# Patient Record
Sex: Female | Born: 1955 | Race: Black or African American | Hispanic: No | Marital: Married
Health system: Southern US, Community
[De-identification: ages and names within clinical notes are randomized; demographics above are authoritative.]

---

## 2008-06-21 ENCOUNTER — Ambulatory Visit: Payer: Self-pay | Admitting: Family Medicine

## 2010-03-12 ENCOUNTER — Ambulatory Visit: Payer: Self-pay | Admitting: Family Medicine

## 2011-12-02 ENCOUNTER — Ambulatory Visit: Payer: Self-pay

## 2013-03-20 ENCOUNTER — Ambulatory Visit: Payer: Self-pay

## 2014-08-13 ENCOUNTER — Ambulatory Visit: Payer: Self-pay

## 2015-08-15 ENCOUNTER — Other Ambulatory Visit: Payer: Self-pay | Admitting: Physician Assistant

## 2015-08-15 DIAGNOSIS — Z139 Encounter for screening, unspecified: Secondary | ICD-10-CM

## 2015-09-02 ENCOUNTER — Ambulatory Visit: Payer: Self-pay | Attending: Oncology

## 2015-09-02 ENCOUNTER — Ambulatory Visit
Admission: RE | Admit: 2015-09-02 | Discharge: 2015-09-02 | Disposition: A | Discharge status: Home / Self Care | Payer: Self-pay | Source: Ambulatory Visit | Attending: Oncology | Admitting: Oncology

## 2015-09-02 ENCOUNTER — Ambulatory Visit: Payer: Self-pay

## 2015-09-02 VITALS — BP 127/82 | HR 75 | Temp 96.3°F | Resp 20 | Ht 70.47 in | Wt 155.3 lb

## 2015-09-02 DIAGNOSIS — Z0000 Encounter for general adult medical examination without abnormal findings: Secondary | ICD-10-CM

## 2015-09-02 NOTE — Progress Notes (Signed)
Subjective:     Patient ID: Meredith Dunn, female   DOB: 1955-08-16, 60 y.o.   MRN: [id number]  HPI   Review of Systems     Objective:   Physical Exam  Pulmonary/Chest:   Right breast exhibits no inverted nipple, no mass, no nipple discharge, no skin change and no tenderness. Left breast exhibits no inverted nipple, no mass, no nipple discharge, no skin change and no tenderness. Breasts are symmetrical.  Soft mobile mass just above bra line on left back.       Assessment:     60 year old patient presents for [facility] clinic visit.  Patient screened, and meets [facility] eligibility.  Patient does not have insurance, Medicare or Medicaid.  Handout given on Affordable Care Act. Instructed patient on breast self-exam using teach back method.  CBE unremarkable. No mass or lump palpated.  Patient reports soft  lump on back right above bra line. Palpable, non-tender, soft, mobile 1 cm lump. Showed it to provider last year, has not changed .  To monitor, and follow-up with PCP if changes or concerns.    Plan:     Sent for bilateral screening mammogram.

## 2015-09-03 NOTE — Progress Notes (Signed)
Letter mailed from [facility] to notify of normal mammogram results.  Patient to return in one year for annual screening.  Copy to [facility]. 

## 2016-11-25 ENCOUNTER — Ambulatory Visit: Payer: Self-pay | Attending: Oncology

## 2016-11-25 ENCOUNTER — Encounter (INDEPENDENT_AMBULATORY_CARE_PROVIDER_SITE_OTHER): Payer: Self-pay

## 2016-11-25 ENCOUNTER — Ambulatory Visit
Admission: RE | Admit: 2016-11-25 | Discharge: 2016-11-25 | Disposition: A | Discharge status: Home / Self Care | Payer: Self-pay | Source: Ambulatory Visit | Attending: Oncology | Admitting: Oncology

## 2016-11-25 VITALS — BP 109/59 | HR 84 | Temp 97.5°F | Resp 18 | Ht 73.0 in | Wt 155.0 lb

## 2016-11-25 DIAGNOSIS — Z0000 Encounter for general adult medical examination without abnormal findings: Secondary | ICD-10-CM

## 2016-11-25 NOTE — Progress Notes (Signed)
Subjective:     Patient ID: Meredith Dunn, female   DOB: Nov 24, 1955, 61 y.o.   MRN: [id number]  HPI   Review of Systems     Objective:   Physical Exam  Pulmonary/Chest: Right breast exhibits no inverted nipple, no mass, no nipple discharge, no skin change and no tenderness. Left breast exhibits no inverted nipple, no mass, no nipple discharge, no skin change and no tenderness. Breasts are symmetrical.  Genitourinary: No labial fusion. There is no rash, tenderness, lesion or injury on the right labia. There is no rash, tenderness, lesion or injury on the left labia. Uterus is deviated. Uterus is not enlarged, not fixed and not tender. Cervix exhibits no motion tenderness, no discharge and no friability. Right adnexum displays no mass, no tenderness and no fullness. Left adnexum displays no mass, no tenderness and no fullness. No erythema, tenderness or bleeding in the vagina. No foreign body in the vagina. No signs of injury around the vagina. No vaginal discharge found.  Genitourinary Comments: Uterus deviated to patients left       Assessment:     61 [facility].  Patient screened, and meets [facility] eligibility.  Patient does not have insurance, Medicare or Medicaid.  Handout given on Affordable Care Act.  Instructed patient on breast self-exam using teach back method.  CBE unremarkable.  No mass or lump palpated.  Pelvic exam normal.    Plan:     Sent for bilateral screening mammogram.  Specimen collected for pap.

## 2016-11-27 LAB — PAP LB AND HPV HIGH-RISK
HPV, high-risk: NEGATIVE
PAP SMEAR COMMENT: 0

## 2016-12-01 NOTE — Progress Notes (Signed)
Letter mailed to patient to notify of normal mammogram, and pap smear results.  Next pap due in 5 years. Patient to return in one year for annual mammogram screening.  Copy to [facility]IS.

## 2016-12-24 ENCOUNTER — Encounter: Payer: Self-pay | Admitting: Family Medicine

## 2018-01-26 ENCOUNTER — Other Ambulatory Visit: Payer: Self-pay

## 2018-01-26 ENCOUNTER — Ambulatory Visit: Payer: Self-pay | Attending: Oncology | Admitting: *Deleted

## 2018-01-26 ENCOUNTER — Ambulatory Visit
Admission: RE | Admit: 2018-01-26 | Discharge: 2018-01-26 | Disposition: A | Discharge status: Home / Self Care | Payer: Self-pay | Source: Ambulatory Visit | Attending: Oncology | Admitting: Oncology

## 2018-01-26 ENCOUNTER — Encounter (INDEPENDENT_AMBULATORY_CARE_PROVIDER_SITE_OTHER): Payer: Self-pay

## 2018-01-26 ENCOUNTER — Encounter: Payer: Self-pay | Admitting: *Deleted

## 2018-01-26 VITALS — BP 127/79 | HR 80 | Temp 97.0°F | Ht 72.0 in | Wt 156.0 lb

## 2018-01-26 DIAGNOSIS — Z0000 Encounter for general adult medical examination without abnormal findings: Secondary | ICD-10-CM

## 2018-01-26 NOTE — Progress Notes (Addendum)
  Subjective:     Patient ID: Meredith Dunn, female   DOB: May 24, 1956, 62 y.o.   MRN: [id number]  HPI   Review of Systems     Objective:   Physical Exam  Pulmonary/Chest:         Assessment:     62 [facility]  Clinical breast exam unremarkable.  Taught self breast awareness.  Last pap on 11/25/16 was negative / negative.  Next pap due in 2023.  Patient has been screened for eligibility.  She does not have any insurance, Medicare or Medicaid.  She also meets financial eligibility.  Hand-out given on the Affordable Care Act. Risk Assessment    Risk Scores      01/26/2018   Last edited by: Scarlett PrestoShaver, Anne F, RN   5-year risk: 1.2 %   Lifetime risk: 4.9 %             Plan:     Screening mammogram ordered.  Will follow-up per [facility] protocol.

## 2018-01-26 NOTE — Patient Instructions (Signed)
Gave patient hand-out, Women Staying Healthy, Active and Well from [facility], with education on breast health, pap smears, heart and colon health. 

## 2018-01-26 NOTE — Progress Notes (Signed)
Letter mailed from the [facility] to inform patient of her normal mammogram results.  Patient is to follow-up with annual screening in one year.  [facility]IS to [facility]. 

## 2019-04-25 ENCOUNTER — Encounter: Payer: Self-pay | Admitting: *Deleted

## 2019-04-25 ENCOUNTER — Other Ambulatory Visit: Payer: Self-pay | Admitting: *Deleted

## 2019-04-25 DIAGNOSIS — Z0000 Encounter for general adult medical examination without abnormal findings: Secondary | ICD-10-CM

## 2019-04-25 NOTE — Progress Notes (Signed)
Called patient today to establish verbal consent for the [facility] program.  2 identifiers were used to identify patient.  Patient has had her health history completed via phone.  No breast problems at this time.  Next pap is due in 2023. See breast risk assessment score.  She will go directly to the [facility] for her mammogram tomorrow.  Will follow up per [facility] protocol. Risk Assessment    No risk assessment data for the current encounter   Risk Scores      01/26/2018   Last edited by: Theodore Demark, RN   5-year risk: 1.2 %   Lifetime risk: 4.9 %

## 2019-04-26 ENCOUNTER — Ambulatory Visit
Admission: RE | Admit: 2019-04-26 | Discharge: 2019-04-26 | Disposition: A | Discharge status: Home / Self Care | Payer: Self-pay | Source: Ambulatory Visit | Attending: Oncology | Admitting: Oncology

## 2019-04-26 ENCOUNTER — Ambulatory Visit: Payer: Self-pay | Attending: Oncology

## 2019-04-26 DIAGNOSIS — Z0000 Encounter for general adult medical examination without abnormal findings: Secondary | ICD-10-CM | POA: Insufficient documentation

## 2019-05-12 ENCOUNTER — Encounter: Payer: Self-pay | Admitting: *Deleted

## 2019-05-12 NOTE — Progress Notes (Signed)
Letter mailed from the [facility] to inform patient of her normal mammogram results.  Patient is to follow-up with annual screening in one year.  [facility]IS to [facility]. 

## 2019-05-20 ENCOUNTER — Other Ambulatory Visit: Payer: Self-pay | Admitting: *Deleted

## 2019-05-20 DIAGNOSIS — Z20822 Contact with and (suspected) exposure to covid-19: Secondary | ICD-10-CM

## 2019-05-22 ENCOUNTER — Telehealth: Payer: Self-pay | Admitting: Hematology

## 2019-05-22 LAB — NOVEL CORONAVIRUS, NAA: SARS-CoV-2, NAA: NOT DETECTED

## 2019-05-22 NOTE — Telephone Encounter (Signed)
Pt is aware covid 19 test is neg on 05-22-2019

## 2020-04-17 ENCOUNTER — Other Ambulatory Visit: Payer: Self-pay

## 2020-04-17 DIAGNOSIS — Z20822 Contact with and (suspected) exposure to covid-19: Secondary | ICD-10-CM

## 2020-04-18 LAB — SARS-COV-2, NAA 2 DAY TAT

## 2020-04-18 LAB — NOVEL CORONAVIRUS, NAA: SARS-CoV-2, NAA: NOT DETECTED

## 2020-04-29 ENCOUNTER — Other Ambulatory Visit: Payer: Self-pay | Admitting: Family Medicine

## 2020-04-29 DIAGNOSIS — Z1231 Encounter for screening mammogram for malignant neoplasm of breast: Secondary | ICD-10-CM

## 2020-06-12 ENCOUNTER — Ambulatory Visit
Admission: RE | Admit: 2020-06-12 | Discharge: 2020-06-12 | Disposition: A | Discharge status: Home / Self Care | Payer: Medicare HMO | Source: Ambulatory Visit | Attending: Family Medicine | Admitting: Family Medicine

## 2020-06-12 ENCOUNTER — Other Ambulatory Visit: Payer: Self-pay

## 2020-06-12 DIAGNOSIS — Z1231 Encounter for screening mammogram for malignant neoplasm of breast: Secondary | ICD-10-CM | POA: Diagnosis not present

## 2021-05-16 ENCOUNTER — Other Ambulatory Visit: Payer: Self-pay | Admitting: Family Medicine

## 2021-05-16 DIAGNOSIS — Z1231 Encounter for screening mammogram for malignant neoplasm of breast: Secondary | ICD-10-CM

## 2021-06-13 ENCOUNTER — Ambulatory Visit
Admission: RE | Admit: 2021-06-13 | Discharge: 2021-06-13 | Disposition: A | Discharge status: Home / Self Care | Payer: Medicare HMO | Source: Ambulatory Visit | Attending: Family Medicine | Admitting: Family Medicine

## 2021-06-13 ENCOUNTER — Other Ambulatory Visit: Payer: Self-pay

## 2021-06-13 DIAGNOSIS — Z1231 Encounter for screening mammogram for malignant neoplasm of breast: Secondary | ICD-10-CM | POA: Diagnosis present

## 2022-05-13 ENCOUNTER — Other Ambulatory Visit: Payer: Self-pay | Admitting: Family Medicine

## 2022-05-13 DIAGNOSIS — Z1231 Encounter for screening mammogram for malignant neoplasm of breast: Secondary | ICD-10-CM

## 2022-07-28 ENCOUNTER — Ambulatory Visit
Admission: RE | Admit: 2022-07-28 | Discharge: 2022-07-28 | Disposition: A | Discharge status: Home / Self Care | Payer: Medicare HMO | Source: Ambulatory Visit | Attending: Family Medicine | Admitting: Family Medicine

## 2022-07-28 DIAGNOSIS — Z1231 Encounter for screening mammogram for malignant neoplasm of breast: Secondary | ICD-10-CM | POA: Diagnosis not present

## 2023-03-23 IMAGING — MG MM DIGITAL SCREENING BILAT W/ TOMO AND CAD
6 of 10 series · 6 of 30 positions shown · non-contrast
Comparison: Previous exam(s).

CLINICAL DATA: Screening.

EXAM:
DIGITAL SCREENING BILATERAL MAMMOGRAM WITH TOMOSYNTHESIS AND CAD
TECHNIQUE: Bilateral screening digital craniocaudal and mediolateral oblique
mammograms were obtained. Bilateral screening digital breast
tomosynthesis was performed. The images were evaluated with
computer-aided detection.

[R CC synth-2D]
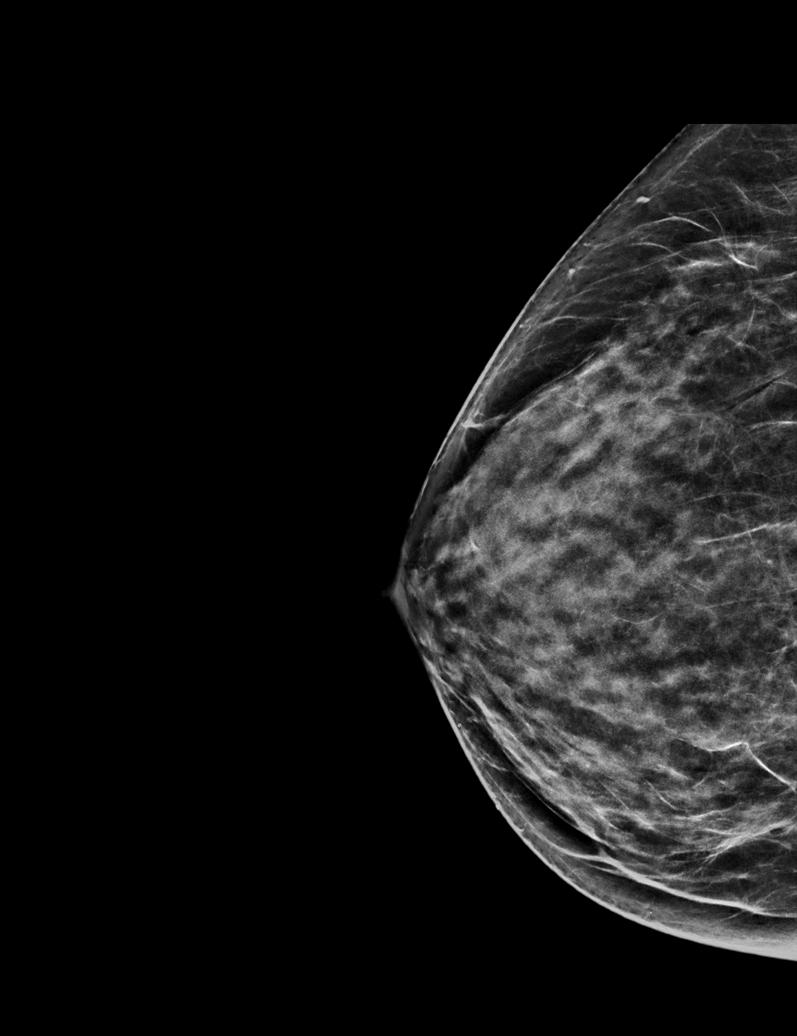

[L MLO synth-2D]
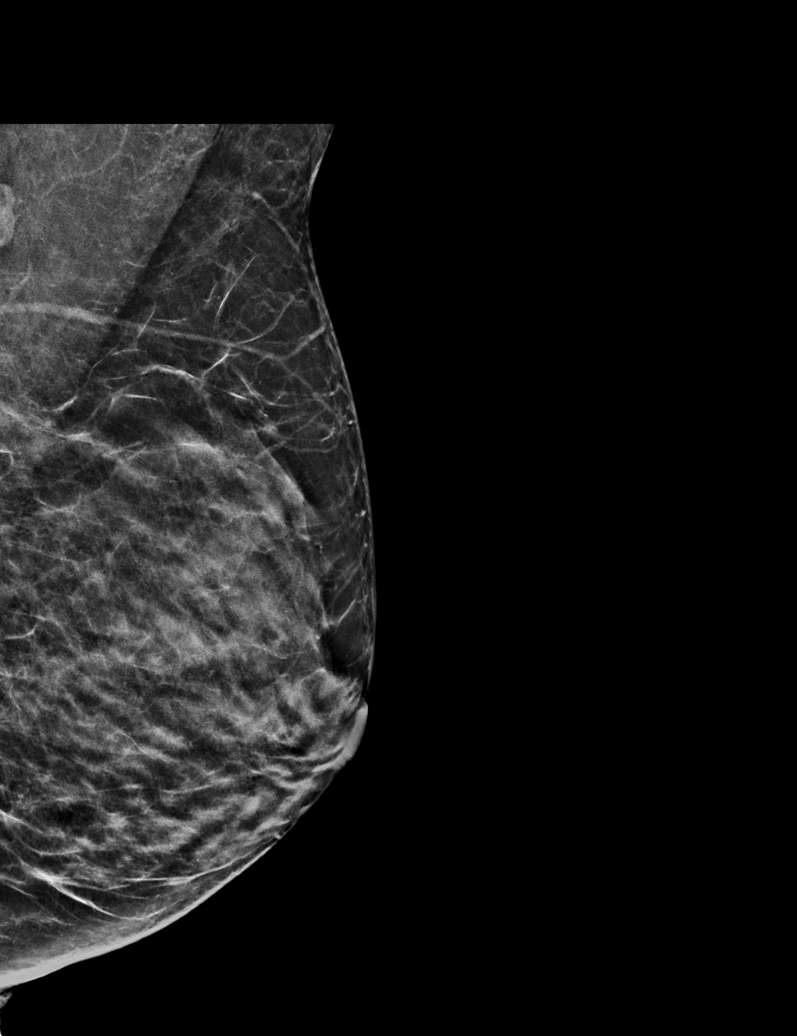

[R CV synth-2D]
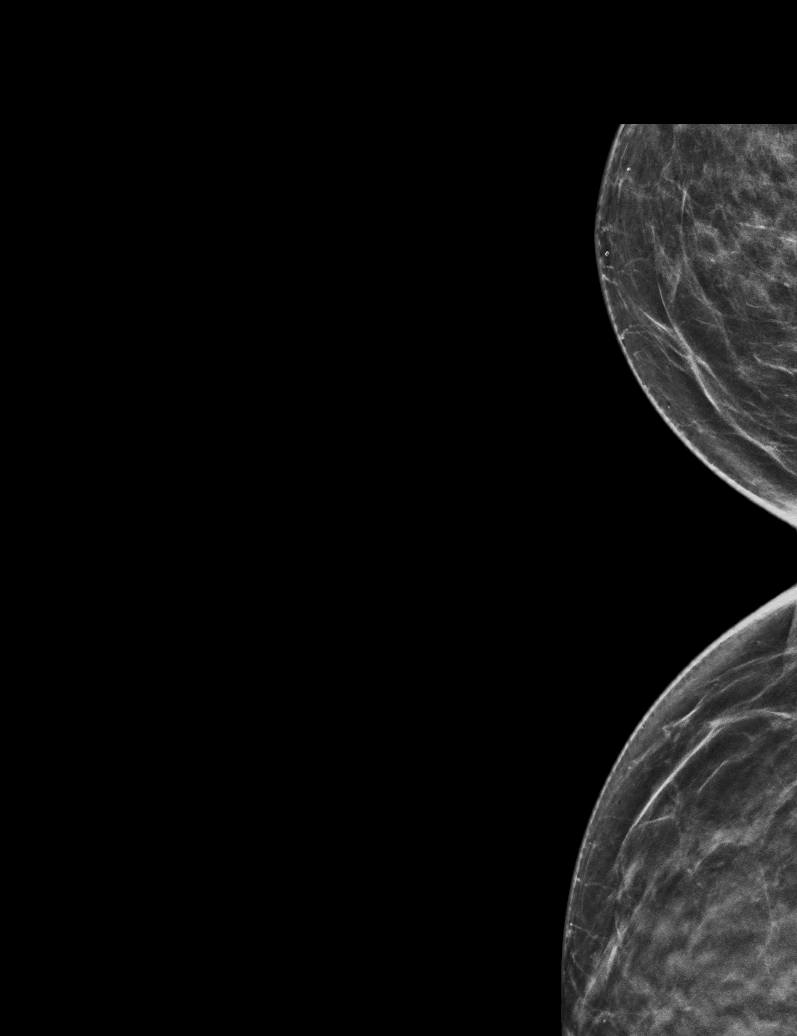

[R MLO synth-2D]
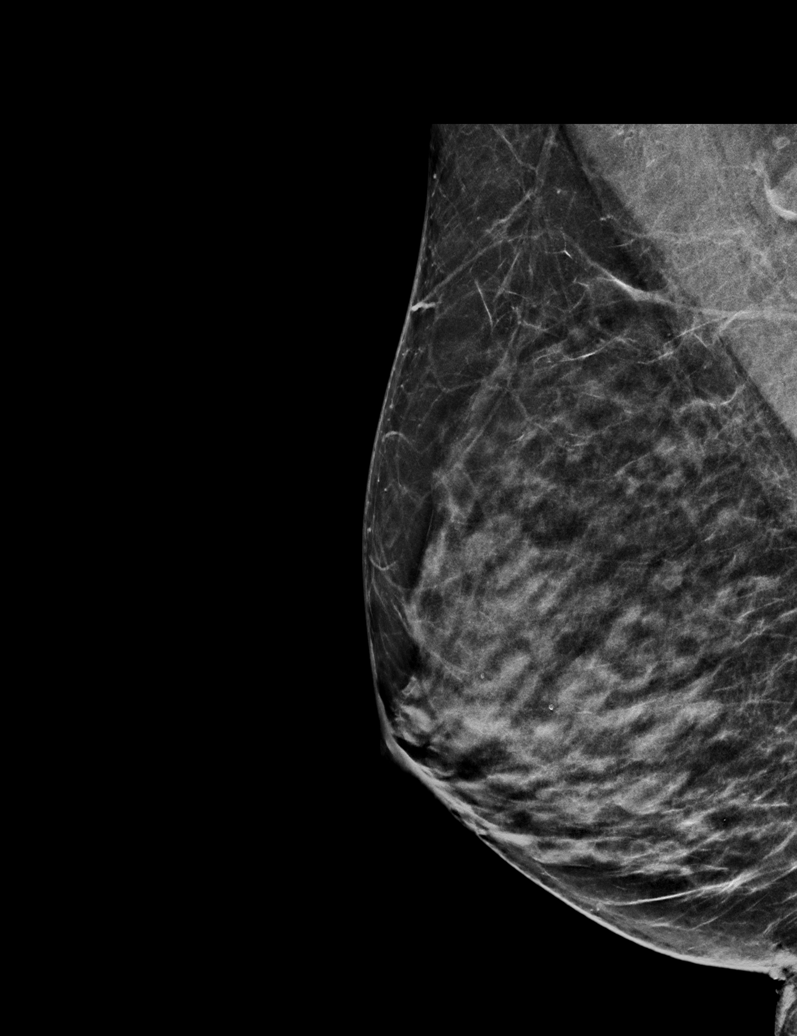

[L CC synth-2D]
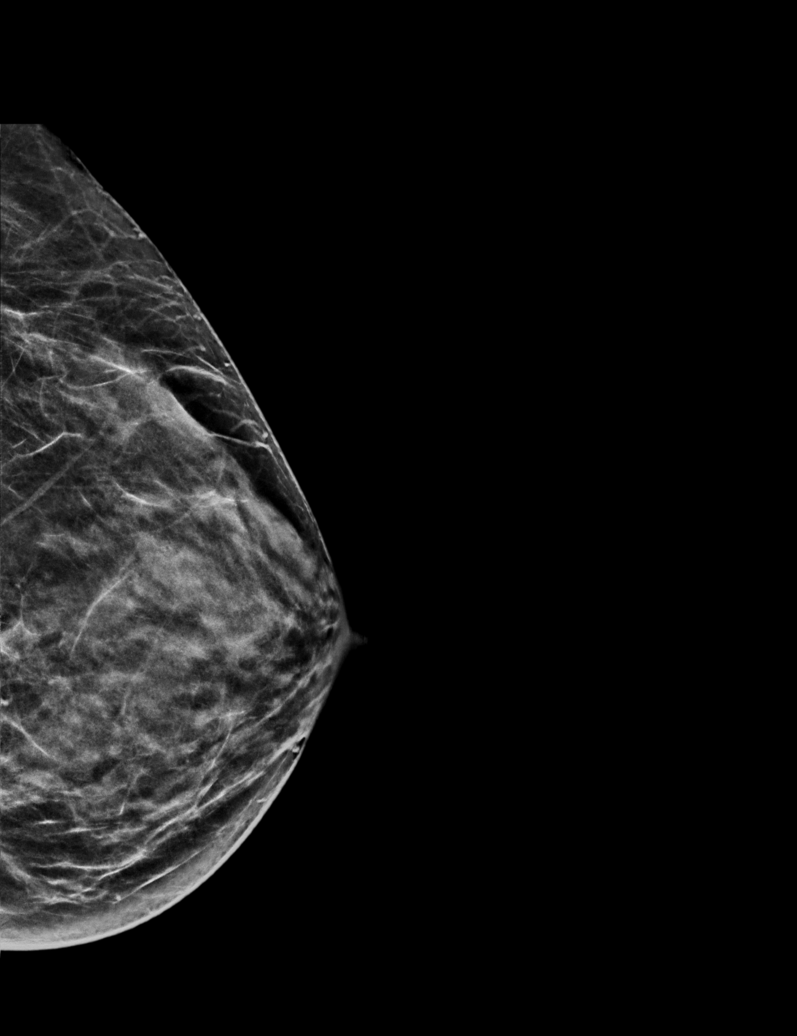

[R CC tomo · tomo slice 29/57.0]
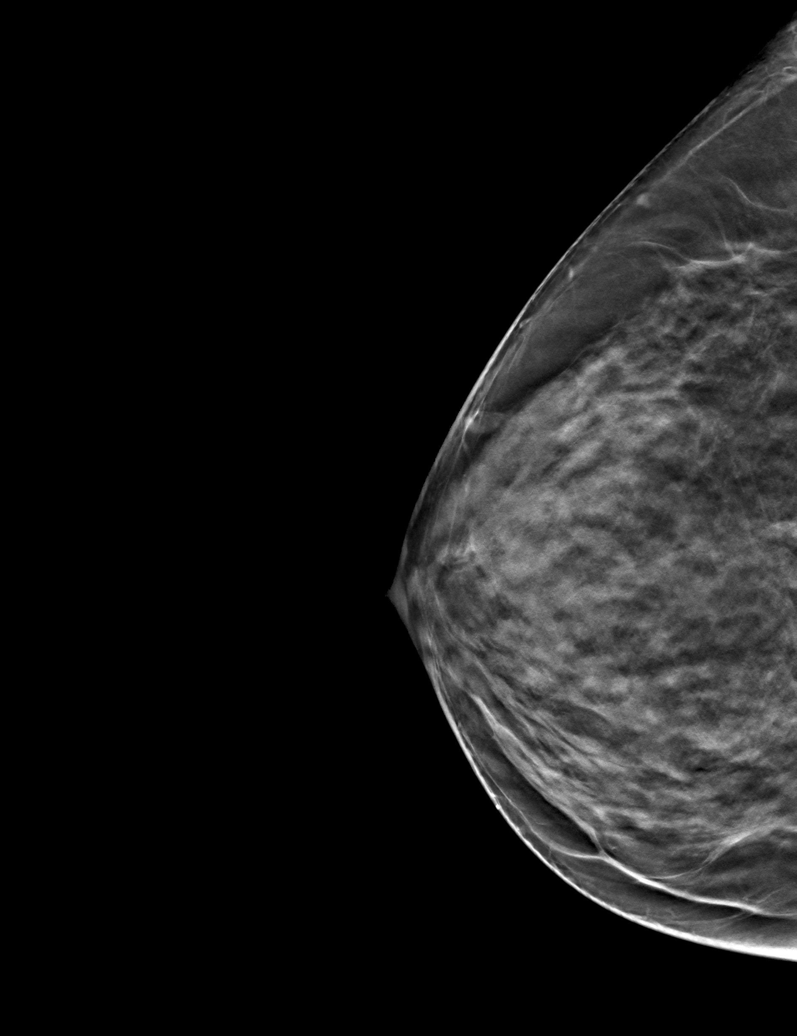

[6 of 30 positions shown; findings below may reference images not displayed]

ACR Breast Density Category c: The breast tissue is heterogeneously
dense, which may obscure small masses.
FINDINGS: There are no findings suspicious for malignancy.
IMPRESSION: No mammographic evidence of malignancy. A result letter of this
screening mammogram will be mailed directly to the patient.

RECOMMENDATION:
Screening mammogram in one year. (Code:Q3-W-BC3)

BI-RADS CATEGORY  1: Negative.

## 2023-06-14 ENCOUNTER — Other Ambulatory Visit: Payer: Self-pay | Admitting: Family Medicine

## 2023-06-14 DIAGNOSIS — Z1231 Encounter for screening mammogram for malignant neoplasm of breast: Secondary | ICD-10-CM

## 2023-08-03 ENCOUNTER — Ambulatory Visit
Admission: RE | Admit: 2023-08-03 | Discharge: 2023-08-03 | Disposition: A | Discharge status: Home / Self Care | Payer: Medicare PPO | Source: Ambulatory Visit | Attending: Family Medicine | Admitting: Family Medicine

## 2023-08-03 DIAGNOSIS — Z1231 Encounter for screening mammogram for malignant neoplasm of breast: Secondary | ICD-10-CM | POA: Diagnosis present

## 2024-04-06 ENCOUNTER — Ambulatory Visit
Admission: RE | Admit: 2024-04-06 | Discharge: 2024-04-06 | Disposition: A | Discharge status: Home / Self Care | Source: Ambulatory Visit | Attending: Family Medicine | Admitting: Family Medicine

## 2024-04-06 ENCOUNTER — Other Ambulatory Visit: Payer: Self-pay | Admitting: Family Medicine

## 2024-04-06 DIAGNOSIS — R202 Paresthesia of skin: Secondary | ICD-10-CM | POA: Insufficient documentation

## 2024-04-06 DIAGNOSIS — M25551 Pain in right hip: Secondary | ICD-10-CM

## 2024-07-28 ENCOUNTER — Other Ambulatory Visit: Payer: Self-pay | Admitting: Family Medicine

## 2024-07-28 DIAGNOSIS — Z1231 Encounter for screening mammogram for malignant neoplasm of breast: Secondary | ICD-10-CM

## 2024-08-22 ENCOUNTER — Encounter
